# Patient Record
Sex: Female | Born: 1975 | Race: Black or African American | Hispanic: No | Marital: Single | Smoking: Current every day smoker
Health system: Southern US, Community
[De-identification: ages and names within clinical notes are randomized; demographics above are authoritative.]

## PROBLEM LIST (undated history)

## (undated) DIAGNOSIS — F419 Anxiety disorder, unspecified: Secondary | ICD-10-CM

## (undated) DIAGNOSIS — J45909 Unspecified asthma, uncomplicated: Secondary | ICD-10-CM

## (undated) DIAGNOSIS — I10 Essential (primary) hypertension: Secondary | ICD-10-CM

## (undated) DIAGNOSIS — K649 Unspecified hemorrhoids: Secondary | ICD-10-CM

## (undated) DIAGNOSIS — R519 Headache, unspecified: Secondary | ICD-10-CM

## (undated) DIAGNOSIS — R51 Headache: Secondary | ICD-10-CM

## (undated) HISTORY — DX: Headache, unspecified: R51.9

## (undated) HISTORY — PX: TUBAL LIGATION: SHX77

## (undated) HISTORY — PX: KNEE SURGERY: SHX244

## (undated) HISTORY — PX: OTHER SURGICAL HISTORY: SHX169

## (undated) HISTORY — DX: Essential (primary) hypertension: I10

## (undated) HISTORY — DX: Headache: R51

## (undated) HISTORY — DX: Unspecified asthma, uncomplicated: J45.909

## (undated) HISTORY — DX: Unspecified hemorrhoids: K64.9

## (undated) HISTORY — DX: Anxiety disorder, unspecified: F41.9

## (undated) HISTORY — PX: CERVICAL SPINE SURGERY: SHX589

---

## 2006-04-07 ENCOUNTER — Ambulatory Visit: Payer: Self-pay | Admitting: Gastroenterology

## 2012-05-27 HISTORY — PX: COLONOSCOPY: SHX174

## 2012-11-04 ENCOUNTER — Encounter: Payer: Self-pay | Admitting: Obstetrics and Gynecology

## 2013-06-23 ENCOUNTER — Encounter: Payer: Self-pay | Admitting: Nurse Practitioner

## 2013-06-26 ENCOUNTER — Encounter: Payer: Self-pay | Admitting: *Deleted

## 2013-06-27 ENCOUNTER — Ambulatory Visit (INDEPENDENT_AMBULATORY_CARE_PROVIDER_SITE_OTHER): Payer: Managed Care, Other (non HMO) | Admitting: Nurse Practitioner

## 2013-06-27 ENCOUNTER — Encounter: Payer: Self-pay | Admitting: *Deleted

## 2013-06-27 VITALS — BP 134/76 | HR 86 | Ht 66.0 in | Wt 216.0 lb

## 2013-06-27 DIAGNOSIS — K648 Other hemorrhoids: Secondary | ICD-10-CM

## 2013-06-27 NOTE — Progress Notes (Signed)
HPI :   Patient is a 38 year old female, new to this practice, referred by PCP for evaluation of hemorrhoids. Patient has a strong family history of colon cancer. Her mother died at 37 years of age with colon cancer, her sister passed away at 37 years of age with colon cancer. Patient had a screening colon[place] March of last [place]. No polyps or other abnormalities found. Patient does not want to return to that gastroenterologist (Dr. Teena DunkBenson). Records to be scanned in EPIC.   Patient denies any previous history of hemorrhoids. Around the 9th of this month she developed some rectal discomfort and bleeding despite completely normal bowel movements. The patient went to her PCP and was prescribed steroid suppositories which she has been using 3 times a day for the last 8 days. In addition, patient was prescribed Bactrim for the "fluctuant" hemorrhoid.. Symptoms did not improve, patient returned to PCP at which time she was given a gram of Rocephin. She has been taking sitz baths. Continues to have some rectal discomfort as well as streaks of blood when wiping. No other GI complaints.  Past Medical History  Diagnosis Date  . Anxiety   . Asthmatic bronchitis   . Headache   . Hemorrhoid   . Hypertension    Family History  Problem Relation Age of Onset  . Colon cancer Mother     Deceased 2002   History  Substance Use Topics  . Smoking status: Current Every Day Smoker  . Smokeless tobacco: Never Used  . Alcohol Use: Yes   Current Outpatient Prescriptions  Medication Sig Dispense Refill  . ALPRAZolam (XANAX) 0.5 MG tablet Take 0.5 mg by mouth at bedtime as needed for anxiety.      Marland Kitchen. HYDROcodone-acetaminophen (NORCO/VICODIN) 5-325 MG per tablet Take 1 tablet by mouth every 6 (six) hours as needed for moderate pain.      Marland Kitchen. lisinopril-hydrochlorothiazide (PRINZIDE,ZESTORETIC) 20-12.5 MG per tablet Take 1 tablet by mouth daily.       No current facility-administered  medications for this visit.   No Known Allergies  Review of Systems: All systems reviewed and negative except where noted in HPI.   Physical Exam: BP 134/76  Pulse 86  Ht 5\' 6"  (1.676 m)  Wt 216 lb (97.977 kg)  BMI 34.88 kg/m2 Constitutional: Pleasant,well-developed, black female in no acute distress. HEENT: Normocephalic and atraumatic. Conjunctivae are normal. No scleral icterus. Neck supple.  Cardiovascular: Normal rate, regular rhythm.  Pulmonary/chest: Effort normal and breath sounds normal. No wheezing, rales or rhonchi. Abdominal: Soft, nondistended, nontender. Bowel sounds active throughout. There are no masses palpable. No hepatomegaly. Rectal: Two external hemorrhoid tags. On anoscopy there were significantly inflamed internal hemorrhoids.  Extremities: no edema Lymphadenopathy: No cervical adenopathy noted. Neurological: Alert and oriented to person place and time. Skin: Skin is warm and dry. No rashes noted. Psychiatric: Normal mood and affect. Behavior is normal.   ASSESSMENT AND PLAN: 38 year old female with hemorrhoidal bleeding and discomfort. She has been taking sitz baths and using rx steroid suppositories TID for 8 days now. Her main concern is the bleeding. This is her first episode of hemorrhoidal problems, it may be a little premature for banding but then again she isn't responding to conservative therapy. Will schedule patient for office banding to be done in a couple of weeks. Patient will call in the interim if symptoms resolve. In meantime I think she could discontinue steroid suppositories as they have been of  limited benefit.

## 2013-06-27 NOTE — Patient Instructions (Signed)
You where scheduled for a hemorrhoid banding with Dr. Arlyce DiceKaplan on 08-21-2013 at 945 am. Please call the office back if symptoms reoccur or get worse. Please avoid constipation, information is below for you to review. __________________________________________________________________________________________   Constipation, Adult Constipation is when a person has fewer than 3 bowel movements a week; has difficulty having a bowel movement; or has stools that are dry, hard, or larger than normal. As people grow older, constipation is more common. If you try to fix constipation with medicines that make you have a bowel movement (laxatives), the problem may get worse. Long-term laxative use may cause the muscles of the colon to become weak. A low-fiber diet, not taking in enough fluids, and taking certain medicines may make constipation worse. CAUSES   Certain medicines, such as antidepressants, pain medicine, iron supplements, antacids, and water pills.   Certain diseases, such as diabetes, irritable bowel syndrome (IBS), thyroid disease, or depression.   Not drinking enough water.   Not eating enough fiber-rich foods.   Stress or travel.  Lack of physical activity or exercise.  Not going to the restroom when there is the urge to have a bowel movement.  Ignoring the urge to have a bowel movement.  Using laxatives too much. SYMPTOMS   Having fewer than 3 bowel movements a week.   Straining to have a bowel movement.   Having hard, dry, or larger than normal stools.   Feeling full or bloated.   Pain in the lower abdomen.  Not feeling relief after having a bowel movement. DIAGNOSIS  Your caregiver will take a medical history and perform a physical exam. Further testing may be done for severe constipation. Some tests may include:   A barium enema X-ray to examine your rectum, colon, and sometimes, your small intestine.  A sigmoidoscopy to examine your lower colon.  A  colonoscopy to examine your entire colon. TREATMENT  Treatment will depend on the severity of your constipation and what is causing it. Some dietary treatments include drinking more fluids and eating more fiber-rich foods. Lifestyle treatments may include regular exercise. If these diet and lifestyle recommendations do not help, your caregiver may recommend taking over-the-counter laxative medicines to help you have bowel movements. Prescription medicines may be prescribed if over-the-counter medicines do not work.  HOME CARE INSTRUCTIONS   Increase dietary fiber in your diet, such as fruits, vegetables, whole grains, and beans. Limit high-fat and processed sugars in your diet, such as Jamaica[place] fries, hamburgers, cookies, candies, and soda.   A fiber supplement may be added to your diet if you cannot get enough fiber from foods.   Drink enough fluids to keep your urine clear or pale yellow.   Exercise regularly or as directed by your caregiver.   Go to the restroom when you have the urge to go. Do not hold it.  Only take medicines as directed by your caregiver. Do not take other medicines for constipation without talking to your caregiver first. SEEK IMMEDIATE MEDICAL CARE IF:   You have bright red blood in your stool.   Your constipation lasts for more than 4 days or gets worse.   You have abdominal or rectal pain.   You have thin, pencil-like stools.  You have unexplained weight loss. MAKE SURE YOU:   Understand these instructions.  Will watch your condition.  Will get help right away if you are not doing well or get worse. Document Released: 11/22/2003 Document Revised: 05/18/2011 Document Reviewed: 12/05/2012 ExitCare Patient Information  2014 [employer].  

## 2013-06-28 NOTE — Progress Notes (Signed)
Reviewed and agree with management. Laurie Lovejoy D. Kaitlynn Tramontana, M.D., FACG  

## 2013-08-21 ENCOUNTER — Encounter: Payer: Managed Care, Other (non HMO) | Admitting: Gastroenterology

## 2014-06-14 ENCOUNTER — Ambulatory Visit: Payer: Managed Care, Other (non HMO) | Admitting: Orthopedic Surgery

## 2014-06-14 ENCOUNTER — Encounter: Payer: Self-pay | Admitting: Orthopedic Surgery

## 2014-09-17 ENCOUNTER — Encounter: Payer: Self-pay | Admitting: Obstetrics and Gynecology

## 2019-03-21 ENCOUNTER — Ambulatory Visit: Payer: Managed Care, Other (non HMO) | Attending: Internal Medicine

## 2019-03-21 ENCOUNTER — Other Ambulatory Visit: Payer: Self-pay

## 2019-03-21 DIAGNOSIS — Z20822 Contact with and (suspected) exposure to covid-19: Secondary | ICD-10-CM

## 2019-03-22 LAB — NOVEL CORONAVIRUS, NAA: SARS-CoV-2, NAA: NOT DETECTED

## 2019-07-21 DIAGNOSIS — Z0000 Encounter for general adult medical examination without abnormal findings: Secondary | ICD-10-CM | POA: Diagnosis not present

## 2019-08-03 DIAGNOSIS — D259 Leiomyoma of uterus, unspecified: Secondary | ICD-10-CM | POA: Diagnosis not present

## 2019-08-03 DIAGNOSIS — N939 Abnormal uterine and vaginal bleeding, unspecified: Secondary | ICD-10-CM | POA: Diagnosis not present

## 2019-08-21 DIAGNOSIS — G43919 Migraine, unspecified, intractable, without status migrainosus: Secondary | ICD-10-CM | POA: Diagnosis not present

## 2019-08-23 DIAGNOSIS — R5383 Other fatigue: Secondary | ICD-10-CM | POA: Diagnosis not present

## 2019-08-23 DIAGNOSIS — Z6831 Body mass index (BMI) 31.0-31.9, adult: Secondary | ICD-10-CM | POA: Diagnosis not present

## 2019-08-23 DIAGNOSIS — R42 Dizziness and giddiness: Secondary | ICD-10-CM | POA: Diagnosis not present

## 2019-08-23 DIAGNOSIS — G43919 Migraine, unspecified, intractable, without status migrainosus: Secondary | ICD-10-CM | POA: Diagnosis not present

## 2019-09-30 ENCOUNTER — Emergency Department (HOSPITAL_COMMUNITY)
Admission: EM | Admit: 2019-09-30 | Discharge: 2019-09-30 | Disposition: A | Discharge status: Home / Self Care | Payer: Managed Care, Other (non HMO) | Attending: Emergency Medicine | Admitting: Emergency Medicine

## 2019-09-30 ENCOUNTER — Encounter (HOSPITAL_COMMUNITY): Payer: Self-pay | Admitting: *Deleted

## 2019-09-30 ENCOUNTER — Other Ambulatory Visit: Payer: Self-pay

## 2019-09-30 DIAGNOSIS — R220 Localized swelling, mass and lump, head: Secondary | ICD-10-CM | POA: Insufficient documentation

## 2019-09-30 DIAGNOSIS — J45909 Unspecified asthma, uncomplicated: Secondary | ICD-10-CM | POA: Insufficient documentation

## 2019-09-30 DIAGNOSIS — F17200 Nicotine dependence, unspecified, uncomplicated: Secondary | ICD-10-CM | POA: Insufficient documentation

## 2019-09-30 DIAGNOSIS — K047 Periapical abscess without sinus: Secondary | ICD-10-CM | POA: Insufficient documentation

## 2019-09-30 DIAGNOSIS — I10 Essential (primary) hypertension: Secondary | ICD-10-CM | POA: Insufficient documentation

## 2019-09-30 MED ORDER — HYDROCODONE-ACETAMINOPHEN 5-325 MG PO TABS: 1.0000 | ORAL_TABLET | Freq: Four times a day (QID) | ORAL | 0 refills | Status: DC | PRN

## 2019-09-30 MED ORDER — AMOXICILLIN-POT CLAVULANATE 875-125 MG PO TABS: 1.0000 | ORAL_TABLET | Freq: Two times a day (BID) | ORAL | 0 refills | Status: AC

## 2019-09-30 NOTE — ED Triage Notes (Signed)
Pt c/o dental pain x 3 days to upper right tooth;

## 2019-09-30 NOTE — ED Provider Notes (Signed)
[facility] EMERGENCY DEPARTMENT Provider Note   CSN: [id number] Arrival date & time: 09/30/19  1353     History Chief Complaint  Patient presents with  . Dental Pain    Annette Li is a 44 y.o. female who presents for evaluation of right-sided dental pain.  Annette Li reports initially Annette Li cracked her tooth about a week or so ago.  Annette Li has had intermittent pain since then.  Annette Li called her dentist and was told to use over-the-counter filling and was told that they would get her in as soon as possible.  The earliest appointment was good to be 10/18/2019.  Annette Li states that Annette Li has been using over-the-counter medication such as Tylenol, Aleve.  Annette Li reports that about 4 days ago, the pain got worse and Annette Li started having facial swelling.  Annette Li states the pain despite taking over the medication using topical dental filler, her pain has increased and Annette Li started having worsening facial swelling.  Annette Li has not any difficulty breathing is able to tolerate secretions without any difficulty.  Annette Li has not noted any fever.  The history is provided by the patient.       Past Medical History:  Diagnosis Date  . Anxiety   . Asthmatic bronchitis   . Headache   . Hemorrhoid   . Hypertension     Patient Active Problem List   Diagnosis Date Noted  . Internal hemorrhoids 06/27/2013    Past Surgical History:  Procedure Laterality Date  . CERVICAL SPINE SURGERY    . COLONOSCOPY  05/27/2012   (normal)Dr. Benson--[place]  . KNEE SURGERY    . TUBAL LIGATION       OB History   No obstetric history on file.     Family History  Problem Relation Age of Onset  . Colon cancer Mother        Deceased 07/12/00    Social History   Tobacco Use  . Smoking status: Current Every Day Smoker    Packs/day: 0.15  . Smokeless tobacco: Never Used  Substance Use Topics  . Alcohol use: Yes    Comment: occasionally  . Drug use: No    Home Medications Prior to Admission medications   Medication Sig Start  Date End Date Taking? Authorizing Provider  ALPRAZolam Prudy Feeler) 0.5 MG tablet Take 0.5 mg by mouth at bedtime as needed for anxiety.    [provider]  amoxicillin-clavulanate (AUGMENTIN) 875-125 MG tablet Take 1 tablet by mouth every 12 (twelve) hours for 7 days. 09/30/19 10/07/19  Maxwell Caul, PA-C  HYDROcodone-acetaminophen (NORCO/VICODIN) 5-325 MG tablet Take 1-2 tablets by mouth every 6 (six) hours as needed. 09/30/19   Maxwell Caul, PA-C  lisinopril-hydrochlorothiazide (PRINZIDE,ZESTORETIC) 20-12.5 MG per tablet Take 1 tablet by mouth daily.    [provider]    Allergies    Patient has no known allergies.  Review of Systems   Review of Systems  Constitutional: Negative for fever.  HENT: Positive for dental problem and facial swelling. Negative for trouble swallowing.   Respiratory: Negative for shortness of breath.   Gastrointestinal: Negative for vomiting.  All other systems reviewed and are negative.   Physical Exam Updated Vital Signs BP (!) 145/95 (BP Location: Right Arm)   Pulse 92   Temp 99.2 F (37.3 C) (Oral)   Resp 18   Ht 5\' 4"  (1.626 m)   Wt 83.9 kg   LMP 09/16/2019   SpO2 100%   BMI 31.76 kg/m   Physical Exam  Vitals and nursing note reviewed.  Constitutional:      Appearance: Annette Li is well-developed.  HENT:     Head: Normocephalic and atraumatic.      Comments: Slight facial swelling noted to the right upper face.  No surrounding warmth, erythema.  Tooth #3 is partially cracked and a small fragment is remaining.  Tooth #2, 1 with dental caries.  There is some surrounding gingival erythema.  No obvious identifiable abscess.  Uvula is midline, airways patent.  No submandibular swelling.  No swelling noted floor of mouth.  No edema noted to the neck. Eyes:     General: No scleral icterus.       Right eye: No discharge.        Left eye: No discharge.     Conjunctiva/sclera: Conjunctivae normal.  Pulmonary:     Effort: Pulmonary  effort is normal.  Skin:    General: Skin is warm and dry.  Neurological:     Mental Status: Annette Li is alert.  Psychiatric:        Speech: Speech normal.        Behavior: Behavior normal.     ED Results / Procedures / Treatments   Labs (all labs ordered are listed, but only abnormal results are displayed) Labs Reviewed - No data to display  EKG None  Radiology No results found.  Procedures Procedures (including critical care time)  Medications Ordered in ED Medications - No data to display  ED Course  I have reviewed the triage vital signs and the nursing notes.  Pertinent labs & imaging results that were available during my care of the patient were reviewed by me and considered in my medical decision making (see chart for details).    MDM Rules/Calculators/A&P                          44 year old female who presents for evaluation of dental pain has been ongoing for last week or so worse in the last 4 days with facial swelling.  No vomiting, fevers, difficulty breathing, difficulty tolerating p.o.  On initially arrival, Annette Li is afebrile, nontoxic-appearing.  Vital signs are stable.  On exam, Annette Li does have some right-sided facial swelling.  No surrounding warmth, erythema.  Tooth #1, 2, 3R with evidence of dental caries.  Tooth #3 is partially cracked with only a small fragment remaining.  Annette Li does have some surrounding gingival erythema.  No obvious abscess that is identified.  Plan to treat with antibiotics.  History/physical exam is not concerning for Ludwig angina.  Patient reviewed on PMP.  Will give short course of pain medication given that it is the weekend.  Patient directed to follow-up with her dentist.  Patient instructed to call her dentist on Monday. At this time, patient exhibits no emergent life-threatening condition that require further evaluation in ED or admission. Patient had ample opportunity for questions and discussion. All patient's questions were answered  with full understanding. Strict return precautions discussed. Patient expresses understanding and agreement to plan.   Portions of this note were generated with Scientist, clinical (histocompatibility and immunogenetics). Dictation errors may occur despite best attempts at proofreading.   Final Clinical Impression(s) / ED Diagnoses Final diagnoses:  Dental abscess    Rx / DC Orders ED Discharge Orders         Ordered    amoxicillin-clavulanate (AUGMENTIN) 875-125 MG tablet  Every 12 hours     Discontinue  Reprint     09/30/19 1453  HYDROcodone-acetaminophen (NORCO/VICODIN) 5-325 MG tablet  Every 6 hours PRN     Discontinue  Reprint     09/30/19 1453           Rosana Hoes 09/30/19 1504    Bethann Berkshire, MD 10/01/19 0900

## 2019-09-30 NOTE — Discharge Instructions (Signed)
Call your dentist on Monday.  Take antibiotics as directed. Please take all of your antibiotics until finished.  You can take Tylenol or Ibuprofen as directed for pain. You can alternate Tylenol and Ibuprofen every 4 hours. If you take Tylenol at 1pm, then you can take Ibuprofen at 5pm. Then you can take Tylenol again at 9pm.   Take pain medications as directed for break through pain. Do not drive or operate machinery while taking this medication.   The exam and treatment you received today has been provided on an emergency basis only. This is not a substitute for complete medical or dental care. If your problem worsens or new symptoms (problems) appear, and you are unable to arrange prompt follow-up care with your dentist, call or return to this location. If you do not have a dentist, please follow-up with one on the list provided  CALL YOUR DENTIST OR RETURN IMMEDIATELY IF you develop a fever, rash, difficulty breathing or swallowing, neck or facial swelling, or other potentially serious concerns.  Please follow-up with one of the dental clinics provided to you below or in your paperwork. Call and tell them you were seen in the Emergency Dept and arrange for an appointment. You may have to call multiple places in order to find a place to be seen.  Dental Assistance If the dentist on-call cannot see you, please use the resources below:   Patients with Medicaid: [facility] [phone] [address], [phone] [address], [phone]  If unable to pay, or uninsured, contact [facility] [phone]) or [facility] Department [phone] in [place], [phone] in [facility]) to become qualified for the adult dental clinic  Other Low-Cost Community Dental Services: [facility]- [address] Natasha Bence [place], [place], [address]    [phone], Ext. [phone]    2nd and 4th Thursday of the month at 6:30am    10 clients each day by appointment, can sometimes see walk-in          patients if someone does not show for an appointment [facility]- [address] [place], [place], [address]    [phone] [facility] [address] [place], [place], [address]    [phone]  [facility]- [phone] [facility] Department- [phone] [facility] Department- [phone]

## 2020-02-20 ENCOUNTER — Emergency Department (HOSPITAL_COMMUNITY)
Admission: EM | Admit: 2020-02-20 | Discharge: 2020-02-20 | Disposition: A | Discharge status: Home / Self Care | Payer: Self-pay | Attending: Emergency Medicine | Admitting: Emergency Medicine

## 2020-02-20 ENCOUNTER — Encounter (HOSPITAL_COMMUNITY): Payer: Self-pay | Admitting: *Deleted

## 2020-02-20 ENCOUNTER — Emergency Department (HOSPITAL_COMMUNITY): Payer: Self-pay

## 2020-02-20 ENCOUNTER — Other Ambulatory Visit: Payer: Self-pay

## 2020-02-20 DIAGNOSIS — F17200 Nicotine dependence, unspecified, uncomplicated: Secondary | ICD-10-CM | POA: Insufficient documentation

## 2020-02-20 DIAGNOSIS — S139XXA Sprain of joints and ligaments of unspecified parts of neck, initial encounter: Secondary | ICD-10-CM | POA: Insufficient documentation

## 2020-02-20 DIAGNOSIS — Y92410 Unspecified street and highway as the place of occurrence of the external cause: Secondary | ICD-10-CM | POA: Insufficient documentation

## 2020-02-20 DIAGNOSIS — I10 Essential (primary) hypertension: Secondary | ICD-10-CM | POA: Insufficient documentation

## 2020-02-20 DIAGNOSIS — G44319 Acute post-traumatic headache, not intractable: Secondary | ICD-10-CM | POA: Insufficient documentation

## 2020-02-20 DIAGNOSIS — M25552 Pain in left hip: Secondary | ICD-10-CM | POA: Insufficient documentation

## 2020-02-20 LAB — PREGNANCY, URINE: Preg Test, Ur: NEGATIVE

## 2020-02-20 MED ORDER — METHOCARBAMOL 500 MG PO TABS: 500.0000 mg | ORAL_TABLET | Freq: Two times a day (BID) | ORAL | 0 refills | Status: DC

## 2020-02-20 MED ORDER — HYDROCODONE-ACETAMINOPHEN 5-325 MG PO TABS
1.0000 | ORAL_TABLET | Freq: Once | ORAL | Status: AC
  Administered 2020-02-20: 11:00:00 1 via ORAL
  Filled 2020-02-20: qty 1

## 2020-02-20 MED ORDER — NAPROXEN 500 MG PO TABS: 500.0000 mg | ORAL_TABLET | Freq: Two times a day (BID) | ORAL | 0 refills | Status: DC

## 2020-02-20 MED ORDER — LIDOCAINE 5 % EX PTCH: 1.0000 | MEDICATED_PATCH | CUTANEOUS | 0 refills | Status: DC
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
## 2020-02-20 NOTE — ED Notes (Signed)
Patient transported to CT 
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
## 2020-10-21 ENCOUNTER — Ambulatory Visit: Payer: Self-pay

## 2020-10-21 NOTE — Telephone Encounter (Signed)
Pt c/o severe vaginal bleeding. Pt stated having baseball sized clots and continuous bleeding. Pt stated she feels occasional lightheadedness but stated not during call. Pt stated that her period started 19 days ago but stopped for a day or 2 then began to bleed harder. Pt stated she is having severe intermittent menstrual type cramping. Advised pt to go to [facility] ED - advised to be driven there. Address and phone number provided. Care advice given and pt verbalized understanding.         Reason for Disposition  SEVERE vaginal bleeding (e.g., soaking 2 pads or tampons per hour and present 2 or more hours; 1 menstrual cup every 2 hours)  Answer Assessment - Initial Assessment Questions 1. AMOUNT: "Describe the bleeding that you are having."    - SPOTTING: spotting, or pinkish / brownish mucous discharge; does not fill panty liner or pad    - MILD:  less than 1 pad / hour; less than patient's usual menstrual bleeding   - MODERATE: 1-2 pads / hour; 1 menstrual cup every 6 hours; small-medium blood clots (e.g., pea, grape, small coin)   - SEVERE: soaking 2 or more pads/hour for 2 or more hours; 1 menstrual cup every 2 hours; bleeding not contained by pads or continuous red blood from vagina; large blood clots (e.g., golf ball, large coin)      severe 2. ONSET: "When did the bleeding begin?" "Is it continuing now?"     19 days ago 3. MENSTRUAL PERIOD: "When was the last normal menstrual period?" "How is this different than your period?"     19 days ago - very heavy flow and clots 4. REGULARITY: "How regular are your periods?"     Irregular  5. ABDOMINAL PAIN: "Do you have any pain?" "How bad is the pain?"  (e.g., Scale 1-10; mild, moderate, or severe)   - MILD (1-3): doesn't interfere with normal activities, abdomen soft and not tender to touch    - MODERATE (4-7): interferes with normal activities or awakens from sleep, abdomen tender to touch    - SEVERE (8-10): excruciating pain,  doubled over, unable to do any normal activities      severe 6. PREGNANCY: "Could you be pregnant?" "Are you sexually active?" "Did you recently give birth?"     No-no-no 7. BREASTFEEDING: "Are you breastfeeding?"     no 8. HORMONES: "Are you taking any hormone medications, prescription or OTC?" (e.g., birth control pills, estrogen)     no 9. BLOOD THINNERS: "Do you take any blood thinners?" (e.g., Coumadin/warfarin, Pradaxa/dabigatran, aspirin)     no 10. CAUSE: "What do you think is causing the bleeding?" (e.g., recent gyn surgery, recent gyn procedure; known bleeding disorder, cervical cancer, polycystic ovarian disease, fibroids)         H/o fibroids when young pt stated that GYN said no longer has fibroids 11. HEMODYNAMIC STATUS: "Are you weak or feeling lightheaded?" If Yes, ask: "Can you stand and walk normally?"       Not right now- yes 12. OTHER SYMPTOMS: "What other symptoms are you having with the bleeding?" (e.g., passed tissue, vaginal discharge, fever, menstrual-type cramps)       Menstrual type, clots baseball sized  Protocols used: Vaginal Bleeding - Abnormal-A-AH

## 2020-12-25 ENCOUNTER — Encounter: Payer: Self-pay | Admitting: Internal Medicine

## 2021-03-20 ENCOUNTER — Ambulatory Visit: Payer: BC Managed Care – PPO

## 2021-03-20 ENCOUNTER — Other Ambulatory Visit: Payer: Self-pay

## 2021-03-20 ENCOUNTER — Encounter: Payer: Self-pay | Admitting: Orthopedic Surgery

## 2021-03-20 ENCOUNTER — Ambulatory Visit (INDEPENDENT_AMBULATORY_CARE_PROVIDER_SITE_OTHER): Payer: BC Managed Care – PPO | Admitting: Orthopedic Surgery

## 2021-03-20 VITALS — BP 162/117 | HR 99 | Ht 64.0 in | Wt 208.0 lb

## 2021-03-20 DIAGNOSIS — G8929 Other chronic pain: Secondary | ICD-10-CM

## 2021-03-20 DIAGNOSIS — M222X1 Patellofemoral disorders, right knee: Secondary | ICD-10-CM | POA: Diagnosis not present

## 2021-03-20 DIAGNOSIS — M25562 Pain in left knee: Secondary | ICD-10-CM | POA: Diagnosis not present

## 2021-03-20 DIAGNOSIS — M222X2 Patellofemoral disorders, left knee: Secondary | ICD-10-CM | POA: Diagnosis not present

## 2021-03-20 DIAGNOSIS — M25561 Pain in right knee: Secondary | ICD-10-CM | POA: Diagnosis not present

## 2021-03-20 MED ORDER — MELOXICAM 7.5 MG PO TABS: 7.5000 mg | ORAL_TABLET | Freq: Every day | ORAL | 5 refills | Status: DC

## 2021-03-20 NOTE — Progress Notes (Signed)
Chief Complaint  Patient presents with   Knee Pain    Bilateral R>L Knee giving way Painful x 3 mths    HPI: This is a 46 year old female presents with pain in her right and left knee.  She started having pain in her right knee back in 2012 and had arthroscopic surgery did well until about 2 months ago when she started having pain in the front of the knee associated with crepitance and swelling.  As this progressed she started having some pain in the left knee as she depended on it more for ambulation    ROS:  Negative for chest pain shortness of breath constipation diarrhea seasonal allergy   Past Medical History:  Diagnosis Date   Anxiety    Asthmatic bronchitis    Headache    Hemorrhoid    Hypertension     BP (!) 162/117    Pulse 99    Ht 5\' 4"  (1.626 m)    Wt 208 lb (94.3 kg)    BMI 35.70 kg/m   Physical Exam Constitutional:      General: She is not in acute distress.    Appearance: She is well-developed.     Comments: Well developed, well nourished Normal grooming and hygiene     Cardiovascular:     Comments: No peripheral edema Musculoskeletal:     Comments: Right knee Skin is normal She is tender around the parapatellar structures with positive crepitance on patellar compression positive quadriceps contraction test Range of motion is normal no effusion All ligaments are stable Muscle tone straight leg raise are normal  Left knee again skin is normal She is tender around the patella her range of motion is normal there is no effusion Ligaments are stable Muscle tone and straight leg raise are normal she has positive quadriceps contraction test and crepitance on patellar compression   Skin:    General: Skin is warm and dry.  Neurological:     Mental Status: She is alert and oriented to person, place, and time.     Sensory: No sensory deficit.     Coordination: Coordination normal.     Gait: Gait normal.     Deep Tendon Reflexes: Reflexes are normal and  symmetric.  Psychiatric:        Mood and Affect: Mood normal.        Behavior: Behavior normal.        Thought Content: Thought content normal.        Judgment: Judgment normal.     Comments: Affect normal    X-rays were taken of both knees and the patella alignment is normal there is no subluxation dislocation or tilt she does have mild peripheral osteophytes around the tibia  Encounter Diagnoses  Name Primary?   Chronic pain of left knee    Chronic pain of right knee    Patellofemoral pain syndrome of both knees Yes   I think this should easily be managed with oral anti-inflammatories, home exercises and bilateral knee injections  Meds ordered this encounter  Medications   meloxicam (MOBIC) 7.5 MG tablet    Sig: Take 1 tablet (7.5 mg total) by mouth daily.    Dispense:  30 tablet    Refill:  5     Procedure note for bilateral knee injections  Procedure note left knee injection verbal consent was obtained to inject left knee joint  Timeout was completed to confirm the site of injection  The medications used were 40 mg depomedrol  and 3 cc of 1% lidocaine  Anesthesia was provided by ethyl chloride and the skin was prepped with alcohol.  After cleaning the skin with alcohol a 20-gauge needle was used to inject the left knee joint. There were no complications. A sterile bandage was applied.   Procedure note right knee injection verbal consent was obtained to inject right knee joint  Timeout was completed to confirm the site of injection  The medications used were 40 mg depomedrol and 3 cc of 1% lidocaine  Anesthesia was provided by ethyl chloride and the skin was prepped with alcohol.  After cleaning the skin with alcohol a 20-gauge needle was used to inject the right knee joint. There were no complications. A sterile bandage was applied.

## 2021-04-01 ENCOUNTER — Other Ambulatory Visit (HOSPITAL_COMMUNITY): Payer: Self-pay | Admitting: Preventative Medicine

## 2021-04-01 ENCOUNTER — Ambulatory Visit (HOSPITAL_COMMUNITY)
Admission: RE | Admit: 2021-04-01 | Discharge: 2021-04-01 | Disposition: A | Discharge status: Home / Self Care | Payer: Worker's Compensation | Source: Ambulatory Visit | Attending: Preventative Medicine | Admitting: Preventative Medicine

## 2021-04-01 ENCOUNTER — Other Ambulatory Visit: Payer: Self-pay

## 2021-04-01 DIAGNOSIS — T1490XA Injury, unspecified, initial encounter: Secondary | ICD-10-CM | POA: Diagnosis not present

## 2021-05-14 ENCOUNTER — Ambulatory Visit: Payer: Medicaid Other | Admitting: Gastroenterology

## 2021-05-14 ENCOUNTER — Encounter: Payer: Self-pay | Admitting: Internal Medicine

## 2021-06-03 ENCOUNTER — Encounter: Payer: Self-pay | Admitting: *Deleted

## 2021-08-07 ENCOUNTER — Ambulatory Visit (INDEPENDENT_AMBULATORY_CARE_PROVIDER_SITE_OTHER): Payer: Self-pay | Admitting: *Deleted

## 2021-08-07 VITALS — Ht 63.0 in | Wt 200.0 lb

## 2021-08-07 DIAGNOSIS — Z1211 Encounter for screening for malignant neoplasm of colon: Secondary | ICD-10-CM
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
## 2021-08-07 NOTE — Progress Notes (Signed)
Okay to schedule.  ASA 2.  We will need urine pregnancy test at preop.

## 2021-08-08 NOTE — Progress Notes (Signed)
Gave Annette Li triage paperwork to schedule in future when pt calls back. 

## 2021-08-08 NOTE — Progress Notes (Signed)
08/08/2021-Lmom for pt to call me back. 

## 2021-08-20 ENCOUNTER — Encounter: Payer: Self-pay | Admitting: *Deleted

## 2021-09-05 ENCOUNTER — Encounter: Payer: Self-pay | Admitting: *Deleted

## 2021-09-05 DIAGNOSIS — Z1211 Encounter for screening for malignant neoplasm of colon: Secondary | ICD-10-CM

## 2021-09-05 MED ORDER — PEG 3350-KCL-NA BICARB-NACL 420 G PO SOLR: ORAL | 0 refills | Status: DC

## 2021-09-05 NOTE — Progress Notes (Signed)
Pt returned call. Scheduled for 8/1 at 10:30am. Aware will mail instructions. Rx sent to pharmacy

## 2021-10-02 ENCOUNTER — Telehealth: Payer: Self-pay | Admitting: *Deleted

## 2021-10-02 NOTE — Telephone Encounter (Signed)
Pt called in. She was a triage for TCS scheduled for 10/07/21. Reports she went to ED and was told to cancel her procedure until she see's a cardiologists and get's approval to be put to sleep by them. Procedure cancelled. FYI to [employer]

## 2021-10-07 ENCOUNTER — Ambulatory Visit (HOSPITAL_COMMUNITY): Admission: RE | Admit: 2021-10-07 | Payer: BC Managed Care – PPO | Source: Home / Self Care

## 2021-10-07 ENCOUNTER — Encounter (HOSPITAL_COMMUNITY): Admission: RE | Payer: Self-pay | Source: Home / Self Care

## 2021-10-07 SURGERY — COLONOSCOPY WITH PROPOFOL
Anesthesia: Monitor Anesthesia Care

## 2021-10-27 NOTE — Progress Notes (Signed)
Cardiology Office Note:    Date:  10/31/2021   ID:  ROLINDA IMPSON, DOB May 29, 1975, MRN [id number]  PCP:  Juliette Alcide, MD   [facility] Providers Cardiologist:  None     Referring MD: Royann Shivers, *   Chief Complaint  Patient presents with   Chest Pain   Hypertension    History of Present Illness:    Annette Li is a 46 y.o. female seen at the request of Wayland Denis PA-C for evaluation of chest pain. She has a history of HTN. She reports that over the past 2 months her  BP has been difficult to control. She has been on losartan for the past 4 years. She also has been experiencing tightness in her chest that is worse with exertion and relieved with rest. She is a smoker. She is currently out on workman's comp for injury in January. Reports injury to right rotator cuff and back. Lipid status is unknown. Was in the ED in July with severe HA. BP elevated. CT head negative.   Past Medical History:  Diagnosis Date   Anxiety    Asthmatic bronchitis    Headache    Hemorrhoid    Hypertension     Past Surgical History:  Procedure Laterality Date   CERVICAL SPINE SURGERY     COLONOSCOPY  05/27/2012   (normal)Dr. Benson--[place]   KNEE SURGERY     TUBAL LIGATION      Current Medications: Current Meds  Medication Sig   ALPRAZolam (XANAX) 0.5 MG tablet Take 0.5 mg by mouth 3 (three) times daily.   amLODipine (NORVASC) 5 MG tablet Take 1 tablet (5 mg total) by mouth daily.   cyclobenzaprine (FLEXERIL) 10 MG tablet Take 10 mg by mouth at bedtime.   DULoxetine (CYMBALTA) 30 MG capsule Take 30 mg by mouth daily.   losartan (COZAAR) 100 MG tablet Take 100 mg by mouth daily.   metoprolol tartrate (LOPRESSOR) 100 MG tablet Take 100 mg 2 hours before coronary ct   omeprazole (PRILOSEC) 20 MG capsule Take 20 mg by mouth daily.   [DISCONTINUED] polyethylene glycol-electrolytes (NULYTELY) 420 g solution As directed     Allergies:   Patient has no known  allergies.   Social History   Socioeconomic History   Marital status: Single    Spouse name: Not on file   Number of children: Not on file   Years of education: Not on file   Highest education level: Not on file  Occupational History   Not on file  Tobacco Use   Smoking status: Every Day    Packs/day: 0.15    Types: Cigarettes   Smokeless tobacco: Never  Substance and Sexual Activity   Alcohol use: Yes    Comment: occasionally   Drug use: No   Sexual activity: Not on file  Other Topics Concern   Not on file  Social History Narrative   On Workman's comp   Social Determinants of Health   Financial Resource Strain: Not on file  Food Insecurity: Not on file  Transportation Needs: Not on file  Physical Activity: Not on file  Stress: Not on file  Social Connections: Not on file     Family History: The patient's family history includes Colon cancer in her mother and sister.  ROS:   Please see the history of present illness.     All other systems reviewed and are negative.  EKGs/Labs/Other Studies Reviewed:    The following studies  were reviewed today:   EKG:  EKG is  ordered today.  The ekg ordered today demonstrates NSR rate 96. Normal. I have personally reviewed and interpreted this study.   Recent Labs: No results found for requested labs within last 365 days.  Recent Lipid Panel No results found for: "CHOL", "TRIG", "HDL", "CHOLHDL", "VLDL", "LDLCALC", "LDLDIRECT" Labs dated 09/09/21: potassium 3.3 otherwise BMET normal. Normal CBC and Mg.   Risk Assessment/Calculations:      HYPERTENSION CONTROL Vitals:   10/31/21 1312 10/31/21 1344  BP: (!) 143/84 (!) 148/90    The patient's blood pressure is elevated above target today.  In order to address the patient's elevated BP: A new medication was prescribed today.            Physical Exam:    VS:  BP (!) 148/90 (BP Location: Right Arm, Patient Position: Sitting, Cuff Size: Large)   Pulse 96   Ht 5'  4" (1.626 m)   Wt 205 lb 3.2 oz (93.1 kg)   SpO2 99%   BMI 35.22 kg/m     Wt Readings from Last 3 Encounters:  10/31/21 205 lb 3.2 oz (93.1 kg)  08/07/21 200 lb (90.7 kg)  03/20/21 208 lb (94.3 kg)     GEN:  Well nourished, well developed in no acute distress HEENT: Normal NECK: No JVD; No carotid bruits LYMPHATICS: No lymphadenopathy CARDIAC: RRR, no murmurs, rubs, gallops RESPIRATORY:  Clear to auscultation without rales, wheezing or rhonchi  ABDOMEN: Soft, non-tender, non-distended MUSCULOSKELETAL:  No edema; No deformity  SKIN: Warm and dry NEUROLOGIC:  Alert and oriented x 3 PSYCHIATRIC:  Normal affect   ASSESSMENT:    1. Chest pain of uncertain etiology   2. Primary hypertension   3. Tobacco abuse   4. Precordial pain    PLAN:    In order of problems listed above:  Chest tightness exertional. This may be related to uncontrolled HTN but concern about angina in this patient with HTN and tobacco abuse. Will arrange for coronary CTA. Will also check lipid panel to further stratify risk. HTN uncontrolled. Will continue losartan. Add amlodipine 5 mg daily. Low sodium/DASH diet.  Tobacco abuse. Counseled on smoking cessation.           Medication Adjustments/Labs and Tests Ordered: Current medicines are reviewed at length with the patient today.  Concerns regarding medicines are outlined above.  Orders Placed This Encounter  Procedures   CT CORONARY MORPH W/CTA COR W/SCORE W/CA W/CM &/OR WO/CM   Basic metabolic panel   Lipid panel   EKG 12-Lead   Meds ordered this encounter  Medications   amLODipine (NORVASC) 5 MG tablet    Sig: Take 1 tablet (5 mg total) by mouth daily.    Dispense:  90 tablet    Refill:  3   metoprolol tartrate (LOPRESSOR) 100 MG tablet    Sig: Take 100 mg 2 hours before coronary ct    Dispense:  1 tablet    Refill:  0    Patient Instructions  Medication Instructions:  Continue same medications   Lab Work: Bmet and lipid panel  today    Testing/Procedures:  Coronary Ct    will be scheduled after approved by insurance   Follow instructions below   Follow-Up: At [facility], you and your health needs are our priority.  As part of our continuing mission to provide you with exceptional heart care, we have created designated Provider Care Teams.  These Care Teams include  your primary Cardiologist (physician) and Advanced Practice Providers (APPs -  Physician Assistants and Nurse Practitioners) who all work together to provide you with the care you need, when you need it.  We recommend signing up for the patient portal called "MyChart".  Sign up information is provided on this After Visit Summary.  MyChart is used to connect with patients for Virtual Visits (Telemedicine).  Patients are able to view lab/test results, encounter notes, upcoming appointments, etc.  Non-urgent messages can be sent to your provider as well.   To learn more about what you can do with MyChart, go to [email or web address].    Your next appointment:  After test    The format for your next appointment: Office    Provider:  Dr.Daylin Gruszka      Your cardiac CT will be scheduled at one of the below locations:   [facility] [address] [place], [place] [address] [phone]  [place] [address] [place], [place] [address] [phone]  If scheduled at [facility], please arrive at the [facility] and Children's Entrance (Entrance C2) of [facility] 30 minutes prior to test start time. You can use the FREE valet parking offered at entrance C (encouraged to control the heart rate for the test)  Proceed to the [facility] Radiology Department (first floor) to check-in and test prep.  All radiology patients and guests should use entrance C2 at [facility], accessed from [facility], even though the hospital's physical address listed is  [address]    If scheduled at [facility], please arrive 15 mins early for check-in and test prep.  Please follow these instructions carefully (unless otherwise directed):  Hold all erectile dysfunction medications at least 3 days (72 hrs) prior to test.  On the Night Before the Test: Be sure to Drink plenty of water. Do not consume any caffeinated/decaffeinated beverages or chocolate 12 hours prior to your test. Do not take any antihistamines 12 hours prior to your test. If the patient has contrast allergy: Patient will need a prescription for Prednisone and very clear instructions (as follows): Prednisone 50 mg - take 13 hours prior to test Take another Prednisone 50 mg 7 hours prior to test Take another Prednisone 50 mg 1 hour prior to test Take Benadryl 50 mg 1 hour prior to test Patient must complete all four doses of above prophylactic medications. Patient will need a ride after test due to Benadryl.  On the Day of the Test: Drink plenty of water until 1 hour prior to the test. Do not eat any food 4 hours prior to the test. You may take your regular medications prior to the test.  Take metoprolol (Lopressor) two hours prior to test. HOLD Furosemide/Hydrochlorothiazide morning of the test. FEMALES- please wear underwire-free bra if available, avoid dresses & tight clothing   *For Clinical Staff only. Please instruct patient the following:* Heart Rate Medication Recommendations for Cardiac CT  Resting HR < 50 bpm  No medication  Resting HR 50-60 bpm and BP >110/50 mmHG   Consider Metoprolol tartrate 25 mg PO 90-120 min prior to scan  Resting HR 60-65 bpm and BP >110/50 mmHG  Metoprolol tartrate 50 mg PO 90-120 minutes prior to scan   Resting HR > 65 bpm and BP >110/50 mmHG  Metoprolol tartrate 100 mg PO 90-120 minutes prior to scan  Consider Ivabradine 10-15 mg PO or a  calcium channel blocker for resting HR >60 bpm and  contraindication to metoprolol tartrate  Consider Ivabradine 10-15 mg PO in combination with metoprolol tartrate for HR >80 bpm         After the Test: Drink plenty of water. After receiving IV contrast, you may experience a mild flushed feeling. This is normal. On occasion, you may experience a mild rash up to 24 hours after the test. This is not dangerous. If this occurs, you can take Benadryl 25 mg and increase your fluid intake. If you experience trouble breathing, this can be serious. If it is severe call 911 IMMEDIATELY. If it is mild, please call our office. If you take any of these medications: Glipizide/Metformin, Avandament, Glucavance, please do not take 48 hours after completing test unless otherwise instructed.  We will call to schedule your test 2-4 weeks out understanding that some insurance companies will need an authorization prior to the service being performed.   For non-scheduling related questions, please contact the cardiac imaging nurse navigator should you have any questions/concerns: Rockwell Alexandria, Cardiac Imaging Nurse Navigator Larey Brick, Cardiac Imaging Nurse Navigator [facility] Direct Office Dial: [phone]   For scheduling needs, including cancellations and rescheduling, please call [place], [phone].    Important Information About Sugar         Signed, Eual Lindstrom [place], MD  10/31/2021 1:46 PM    [facility]

## 2021-10-31 ENCOUNTER — Ambulatory Visit (INDEPENDENT_AMBULATORY_CARE_PROVIDER_SITE_OTHER): Payer: BC Managed Care – PPO | Admitting: Cardiology

## 2021-10-31 ENCOUNTER — Encounter: Payer: Self-pay | Admitting: Cardiology

## 2021-10-31 VITALS — BP 148/90 | HR 96 | Ht 64.0 in | Wt 205.2 lb

## 2021-10-31 DIAGNOSIS — I10 Essential (primary) hypertension: Secondary | ICD-10-CM

## 2021-10-31 DIAGNOSIS — R079 Chest pain, unspecified: Secondary | ICD-10-CM

## 2021-10-31 DIAGNOSIS — R072 Precordial pain: Secondary | ICD-10-CM | POA: Diagnosis not present

## 2021-10-31 DIAGNOSIS — Z720 Tobacco use: Secondary | ICD-10-CM

## 2021-10-31 MED ORDER — METOPROLOL TARTRATE 100 MG PO TABS: ORAL_TABLET | ORAL | 0 refills | Status: AC

## 2021-10-31 MED ORDER — AMLODIPINE BESYLATE 5 MG PO TABS: 5.0000 mg | ORAL_TABLET | Freq: Every day | ORAL | 3 refills | Status: DC

## 2021-10-31 NOTE — Patient Instructions (Addendum)
Medication Instructions:  Start Amlodipine 5 mg daily  Continue all other medications   Lab Work: Bmet and lipid panel today    Testing/Procedures:  Coronary Ct    will be scheduled after approved by insurance   Follow instructions below   Follow-Up: At [facility], you and your health needs are our priority.  As part of our continuing mission to provide you with exceptional heart care, we have created designated Provider Care Teams.  These Care Teams include your primary Cardiologist (physician) and Advanced Practice Providers (APPs -  Physician Assistants and Nurse Practitioners) who all work together to provide you with the care you need, when you need it.  We recommend signing up for the patient portal called "MyChart".  Sign up information is provided on this After Visit Summary.  MyChart is used to connect with patients for Virtual Visits (Telemedicine).  Patients are able to view lab/test results, encounter notes, upcoming appointments, etc.  Non-urgent messages can be sent to your provider as well.   To learn more about what you can do with MyChart, go to [email or web address].    Your next appointment:  After test    The format for your next appointment: Office    Provider:  Dr.Jordan      Your cardiac CT will be scheduled at one of the below locations:   [facility] [address] [place], [place] [address] [phone]  OR  [facility] [address] [place], [place] [address] [phone]  If scheduled at [facility], please arrive at the [facility] and Children's Entrance (Entrance C2) of [facility] 30 minutes prior to test start time. You can use the FREE valet parking offered at entrance C (encouraged to control the heart rate for the test)  Proceed to the [facility] Radiology Department (first floor) to check-in and test prep.  All radiology patients and guests should use  entrance C2 at [facility], accessed from [facility], even though the hospital's physical address listed is [address].    If scheduled at [facility], please arrive 15 mins early for check-in and test prep.  Please follow these instructions carefully (unless otherwise directed):    On the Night Before the Test: Be sure to Drink plenty of water. Do not consume any caffeinated/decaffeinated beverages or chocolate 12 hours prior to your test. Do not take any antihistamines 12 hours prior to your test.   On the Day of the Test: Drink plenty of water until 1 hour prior to the test. Do not eat any food 4 hours prior to the test. You may take your regular medications prior to the test.  Take metoprolol 100 mg two hours prior to test. FEMALES- please wear underwire-free bra if available, avoid dresses & tight clothing          After the Test: Drink plenty of water. After receiving IV contrast, you may experience a mild flushed feeling. This is normal. On occasion, you may experience a mild rash up to 24 hours after the test. This is not dangerous. If this occurs, you can take Benadryl 25 mg and increase your fluid intake. If you experience trouble breathing, this can be serious. If it is severe call 911 IMMEDIATELY. If it is mild, please call our office.   We will call to schedule your test 2-4 weeks out understanding that some insurance companies will need an authorization prior to  the service being performed.   For non-scheduling related questions, please contact the cardiac imaging nurse navigator should you have any questions/concerns: Rockwell [place], Cardiac Imaging Nurse Navigator Larey Brick, Cardiac Imaging Nurse Navigator [facility] Direct Office Dial: [phone]   For scheduling needs, including cancellations and rescheduling, please call [place], [phone].    Important  Information About Sugar

## 2021-11-01 LAB — LIPID PANEL
Chol/HDL Ratio: 6.3 ratio — ABNORMAL HIGH (ref 0.0–4.4)
Cholesterol, Total: 311 mg/dL — ABNORMAL HIGH (ref 100–199)
HDL: 49 mg/dL (ref >39)
LDL Chol Calc (NIH): 216 mg/dL — ABNORMAL HIGH (ref 0–99)
Triglycerides: 232 mg/dL — ABNORMAL HIGH (ref 0–149)
VLDL Cholesterol Cal: 46 mg/dL — ABNORMAL HIGH (ref 5–40)

## 2021-11-01 LAB — BASIC METABOLIC PANEL
BUN/Creatinine Ratio: 11 (ref 9–23)
BUN: 8 mg/dL (ref 6–24)
CO2: 23 mmol/L (ref 20–29)
Calcium: 9.5 mg/dL (ref 8.7–10.2)
Chloride: 103 mmol/L (ref 96–106)
Creatinine, Ser: 0.75 mg/dL (ref 0.57–1.00)
Glucose: 91 mg/dL (ref 70–99)
Potassium: 4.3 mmol/L (ref 3.5–5.2)
Sodium: 142 mmol/L (ref 134–144)
eGFR: 100 mL/min/{1.73_m2} (ref >59)

## 2021-11-12 ENCOUNTER — Other Ambulatory Visit: Payer: Self-pay

## 2021-11-12 DIAGNOSIS — E782 Mixed hyperlipidemia: Secondary | ICD-10-CM

## 2021-11-12 DIAGNOSIS — E7800 Pure hypercholesterolemia, unspecified: Secondary | ICD-10-CM

## 2021-11-12 MED ORDER — ROSUVASTATIN CALCIUM 20 MG PO TABS: 20.0000 mg | ORAL_TABLET | Freq: Every day | ORAL | 3 refills | Status: AC

## 2021-11-19 ENCOUNTER — Telehealth (HOSPITAL_COMMUNITY): Payer: Self-pay | Admitting: *Deleted

## 2021-11-19 NOTE — Telephone Encounter (Signed)
Reaching out to patient to offer assistance regarding upcoming cardiac imaging study; pt verbalizes understanding of appt date/time, parking situation and where to check in, pre-test NPO status and medications ordered, and verified current allergies; name and call back number provided for further questions should they arise  Joyceann Kruser RN Navigator Cardiac Imaging [facility] [phone] office [phone] cell  Patient to take 100mg metoprolol tartrate two hours prior to her cardiac CT scan. She is aware to arrive at 3:30pm. 

## 2021-11-20 ENCOUNTER — Ambulatory Visit (HOSPITAL_COMMUNITY)
Admission: RE | Admit: 2021-11-20 | Discharge: 2021-11-20 | Disposition: A | Discharge status: Home / Self Care | Payer: BC Managed Care – PPO | Source: Ambulatory Visit | Attending: Cardiology | Admitting: Cardiology

## 2021-11-20 DIAGNOSIS — Z720 Tobacco use: Secondary | ICD-10-CM | POA: Insufficient documentation

## 2021-11-20 DIAGNOSIS — I10 Essential (primary) hypertension: Secondary | ICD-10-CM | POA: Insufficient documentation

## 2021-11-20 DIAGNOSIS — R079 Chest pain, unspecified: Secondary | ICD-10-CM | POA: Diagnosis present

## 2021-11-20 DIAGNOSIS — R072 Precordial pain: Secondary | ICD-10-CM | POA: Diagnosis present

## 2021-11-20 MED ORDER — NITROGLYCERIN 0.4 MG SL SUBL
0.8000 mg | SUBLINGUAL_TABLET | Freq: Once | SUBLINGUAL | Status: AC
Start: 2021-11-20 — End: 2021-11-20
  Administered 2021-11-20: 0.8 mg via SUBLINGUAL

## 2021-11-20 MED ORDER — SODIUM CHLORIDE 0.9 % IV SOLN: INTRAVENOUS | Status: DC

## 2021-11-20 MED ORDER — NITROGLYCERIN 0.4 MG SL SUBL
SUBLINGUAL_TABLET | SUBLINGUAL | Status: AC
  Filled 2021-11-20: qty 2

## 2021-11-20 MED ORDER — IOHEXOL 350 MG/ML SOLN
100.0000 mL | Freq: Once | INTRAVENOUS | Status: AC | PRN
  Administered 2021-11-20: 100 mL via INTRAVENOUS

## 2021-11-24 ENCOUNTER — Telehealth: Payer: Self-pay | Admitting: Cardiology

## 2021-11-24 DIAGNOSIS — R21 Rash and other nonspecific skin eruption: Secondary | ICD-10-CM

## 2021-11-24 MED ORDER — PREDNISONE 5 MG PO TABS: ORAL_TABLET | ORAL | 0 refills | Status: DC

## 2021-11-24 MED ORDER — PREDNISONE 5 MG PO TABS: ORAL_TABLET | ORAL | 0 refills | Status: AC

## 2021-11-24 NOTE — Telephone Encounter (Signed)
Patient states 9/14 CT caused a severe reaction. On Friday, 9/15 she had redness underneath her breasts. She states she read that if a rash develops then she should take Benedryl and stay hydrated, so she took a Benadryl. However, Saturday, 9/16, it got worse--redness spread to her face, chest, stomach, back, buttocks, between legs. She also developed itchy bumps all over her body. She states she used lotion and Benedryl extra strength, but still no relief. Please advise.

## 2021-11-24 NOTE — Telephone Encounter (Signed)
Patient states that a day after her CCTA, she has broken out with a generalized rash with "bumps" and is itching. She took benadryl as recommended and has used benadryl cream with no relief. She stated her PCP told her to contact our clinic. No issues with breathing. Please advise.

## 2021-11-24 NOTE — Telephone Encounter (Signed)
Informed patient of deltasone order and how to take the medication. She verbalized understanding and stated if this doe not help, she wants a steroid injection.

## 2021-11-24 NOTE — Addendum Note (Signed)
Addended by: Betha Loa F on: 11/24/2021 01:01 PM   Modules accepted: Orders

## 2021-11-26 ENCOUNTER — Other Ambulatory Visit: Payer: Self-pay | Admitting: Physician Assistant

## 2021-11-26 ENCOUNTER — Other Ambulatory Visit (HOSPITAL_COMMUNITY): Payer: Self-pay | Admitting: Physician Assistant

## 2021-11-26 DIAGNOSIS — K769 Liver disease, unspecified: Secondary | ICD-10-CM

## 2021-12-01 ENCOUNTER — Ambulatory Visit: Payer: BC Managed Care – PPO | Admitting: Cardiology

## 2021-12-18 ENCOUNTER — Ambulatory Visit (HOSPITAL_COMMUNITY)
Admission: RE | Admit: 2021-12-18 | Discharge: 2021-12-18 | Disposition: A | Discharge status: Home / Self Care | Payer: BC Managed Care – PPO | Source: Ambulatory Visit | Attending: Physician Assistant | Admitting: Physician Assistant

## 2021-12-18 DIAGNOSIS — K769 Liver disease, unspecified: Secondary | ICD-10-CM | POA: Insufficient documentation

## 2021-12-18 MED ORDER — GADOBUTROL 1 MMOL/ML IV SOLN
10.0000 mL | Freq: Once | INTRAVENOUS | Status: AC | PRN
  Administered 2021-12-18: 10 mL via INTRAVENOUS

## 2022-05-07 ENCOUNTER — Encounter: Payer: Self-pay | Admitting: Radiology

## 2022-10-13 ENCOUNTER — Other Ambulatory Visit (HOSPITAL_COMMUNITY): Payer: Self-pay | Admitting: Family Medicine

## 2022-10-13 DIAGNOSIS — J0101 Acute recurrent maxillary sinusitis: Secondary | ICD-10-CM

## 2022-10-13 DIAGNOSIS — R519 Headache, unspecified: Secondary | ICD-10-CM

## 2022-10-22 ENCOUNTER — Encounter (HOSPITAL_BASED_OUTPATIENT_CLINIC_OR_DEPARTMENT_OTHER): Payer: Self-pay

## 2022-10-22 ENCOUNTER — Ambulatory Visit (HOSPITAL_BASED_OUTPATIENT_CLINIC_OR_DEPARTMENT_OTHER): Payer: Commercial Managed Care - HMO

## 2022-11-12 ENCOUNTER — Telehealth: Payer: Self-pay | Admitting: Orthopedic Surgery

## 2022-11-12 NOTE — Telephone Encounter (Signed)
Returned the patient's call, lvm for the patient to call us back. 

## 2023-01-21 ENCOUNTER — Ambulatory Visit: Payer: Commercial Managed Care - HMO | Admitting: Neurology

## 2023-01-21 ENCOUNTER — Telehealth: Payer: Self-pay | Admitting: Neurology

## 2023-01-21 NOTE — Telephone Encounter (Signed)
NS for new Neuro consult.

## 2023-02-09 ENCOUNTER — Encounter: Payer: Self-pay | Admitting: Neurology

## 2023-02-09 ENCOUNTER — Ambulatory Visit (INDEPENDENT_AMBULATORY_CARE_PROVIDER_SITE_OTHER): Payer: Commercial Managed Care - HMO | Admitting: Neurology

## 2023-02-09 VITALS — BP 173/103 | HR 77 | Ht 64.0 in | Wt 222.0 lb

## 2023-02-09 DIAGNOSIS — E669 Obesity, unspecified: Secondary | ICD-10-CM

## 2023-02-09 DIAGNOSIS — R030 Elevated blood-pressure reading, without diagnosis of hypertension: Secondary | ICD-10-CM | POA: Diagnosis not present

## 2023-02-09 DIAGNOSIS — R519 Headache, unspecified: Secondary | ICD-10-CM

## 2023-02-09 DIAGNOSIS — G4440 Drug-induced headache, not elsewhere classified, not intractable: Secondary | ICD-10-CM

## 2023-02-09 DIAGNOSIS — R0683 Snoring: Secondary | ICD-10-CM

## 2023-02-09 DIAGNOSIS — R351 Nocturia: Secondary | ICD-10-CM

## 2023-02-09 DIAGNOSIS — Z9189 Other specified personal risk factors, not elsewhere classified: Secondary | ICD-10-CM | POA: Diagnosis not present

## 2023-02-09 DIAGNOSIS — G4489 Other headache syndrome: Secondary | ICD-10-CM

## 2023-02-09 MED ORDER — RIZATRIPTAN BENZOATE 5 MG PO TABS: 5.0000 mg | ORAL_TABLET | ORAL | 1 refills | Status: DC | PRN

## 2023-02-09 NOTE — Progress Notes (Signed)
Subjective:    Patient ID: Annette Li is a 47 y.o. female.  HPI    Huston Foley, MD, PhD [facility] [address] [address] [place], [place] [address]  Dear Natalia Leatherwood,  I saw your patient, Annette Li, upon your kind request in my neurologic clinic today for initial consultation of her headaches.  The patient is unaccompanied today.  She missed an appointment on 01/21/2023.  As you know, [name] is a 47 year old female with an underlying medical history of hypertension, anxiety, allergies, sinusitis, asthma, and obesity, who reports a several month history of recurrent headaches. Headaches are in the bifrontal areas and described as sharp and shooting pains, she often wakes up with a headache.  She has occasional photophobia, occasional nausea, rare vomiting.  She has a family history of migraines affecting 2 sisters.  She has been on propranolol long-acting 60 mg once daily for the past nearly 3 months.  She has not noticed a big difference with it.  She has not taken any migraine medication but does take multiple over-the-counter medications, including extra strength Tylenol 2 tablets every 4-6 hours, ibuprofen over-the-counter or Aleve over-the-counter 2 tablets at a time every 4-6 hours or Excedrin Migraine.  She takes medications nearly daily at this time.  She has nearly daily headaches at this time.  She is typically up-to-date with her eye exam.  She goes to my eye doctor and had an appointment in March, she had a dilated exam, she has contacts and eyeglasses.  She denies any sudden onset of one-sided weakness or numbness or tingling or droopy face or slurring of speech.  She has neck pain which occasionally radiates to the right arm.  She had prior cervical spine surgery in 2017.   I reviewed your office note from 10/12/2022.  She has been treated with cyclobenzaprine and propranolol.  She had blood work through your office on 10/12/2022 and I was able to  review the results in her paper chart: CMP showed benign findings, glucose level was 107 which is borderline elevated.  BUN was 7, creatinine 0.57, liver enzymes normal.  CBC with differential and platelets benign, TSH was 2.68, COVID test negative.  She had a head CT without contrast through [facility] health on 09/09/2021 and I reviewed the results: Impression: No acute intracranial abnormalities.  Normal brain.  You ordered a CT of the sinuses.  I did not see those test results.   She snores, she has woken up with a sense of gasping for air.  She has nocturia about once or twice per average night and has frequently woken up with a headache.  She has never had a sleep study.  Her Epworth sleepiness score is 8 out of 24, fatigue severity score is 46 out of 63.  Weight wise, she had been able to lose weight in the past, she was below 200 pounds but gained most if not all weight back.  She works in Clinical biochemist in an office situation.  She smokes about 5 cigarettes/day.  She drinks alcohol rarely, no daily caffeine.    Her Past Medical History Is Significant For: Past Medical History:  Diagnosis Date   Anxiety    Asthmatic bronchitis    Headache    Hemorrhoid    Hypertension     Her Past Surgical History Is Significant For: Past Surgical History:  Procedure Laterality Date   CERVICAL SPINE SURGERY     COLONOSCOPY  05/27/2012   (normal)Dr.  Benson--[place]   KNEE SURGERY     TUBAL LIGATION     uterine ablation      Her Family History Is Significant For: Family History  Problem Relation Age of Onset   Colon cancer Mother        Deceased 02/20/01   Colon cancer Sister    Migraines Sister    Migraines Sister    Other Son        headaches    Her Social History Is Significant For: Social History   Socioeconomic History   Marital status: Single    Spouse name: Not on file   Number of children: Not on file   Years of education: Not on file   Highest education level: Not on file   Occupational History   Not on file  Tobacco Use   Smoking status: Every Day    Current packs/day: 0.15    Types: Cigarettes   Smokeless tobacco: Never   Tobacco comments:    4-5 cigarettes per day  Vaping Use   Vaping status: Never Used  Substance and Sexual Activity   Alcohol use: Yes    Comment: occasionally "here and there, hit or miss"   Drug use: No   Sexual activity: Not on file  Other Topics Concern   Not on file  Social History Narrative   On Workman's comp         Update 02/09/2023   Caffeine: here and there   Right handed   Social Determinants of Health   Financial Resource Strain: Not on file  Food Insecurity: Not on file  Transportation Needs: Not on file  Physical Activity: Not on file  Stress: Not on file  Social Connections: Unknown (07/07/2021)   Received from [facility]   Social Network    Social Network: Not on file    Her Allergies Are:  Allergies  Allergen Reactions   Other     Possible allergic reaction to contrast or other medication given during coronary CT. "Took my skin off", peeling from shoulders down   :   Her Current Medications Are:  Outpatient Encounter Medications as of 02/09/2023  Medication Sig   ALPRAZolam (XANAX) 0.5 MG tablet Take 0.5 mg by mouth 3 (three) times daily.   cyclobenzaprine (FLEXERIL) 10 MG tablet Take 10 mg by mouth at bedtime.   losartan (COZAAR) 100 MG tablet Take 100 mg by mouth daily.   omeprazole (PRILOSEC) 20 MG capsule Take 20 mg by mouth daily.   propranolol ER (INDERAL LA) 60 MG 24 hr capsule Take 60 mg by mouth daily.   metoprolol tartrate (LOPRESSOR) 100 MG tablet Take 100 mg 2 hours before coronary ct (Patient not taking: Reported on 02/09/2023)   predniSONE (DELTASONE) 5 MG tablet Take 40mg  (8 tablets together on days 1 and 2). Take 30mg  (6 tablets together) on day 3. Take 20mg  (4 tablets) on day 4. Take 10mg  (2 tablets together) on day 5. (Patient not taking: Reported on 02/09/2023)    rosuvastatin (CRESTOR) 20 MG tablet Take 1 tablet (20 mg total) by mouth daily.   [DISCONTINUED] amLODipine (NORVASC) 5 MG tablet Take 1 tablet (5 mg total) by mouth daily. (Patient not taking: Reported on 02/09/2023)   [DISCONTINUED] DULoxetine (CYMBALTA) 30 MG capsule Take 30 mg by mouth daily. (Patient not taking: Reported on 02/09/2023)   No facility-administered encounter medications on file as of 02/09/2023.  :   Review of Systems:  Out of a complete 14 point  review of systems, all are reviewed and negative with the exception of these symptoms as listed below:   Review of Systems  Neurological:        Patient is here alone for evaluation of headaches. She has been having them for several months. She has no known head injury that precipitated the headaches. She is on Propranolol daily. She has also tried Tylenol, Aleve, Excedrin, Ibuprofen and only sometimes do they help. She will get the headaches for days, "bad".    Objective:  Neurological Exam  Physical Exam Physical Examination:   Vitals:   02/09/23 0754  BP: (!) 173/103  Pulse: 77    General Examination: The patient is a very pleasant 47 y.o. female in no acute distress. She appears well-developed and well-nourished and well groomed.   HEENT: Normocephalic, atraumatic, pupils are equal, round and reactive to light, extraocular tracking is good without limitation to gaze excursion or nystagmus noted.  Funduscopic exam benign, no photophobia.  Hearing is grossly intact. Face is symmetric with normal facial animation and normal facial sensation to light touch, temperature and vibration sense. Speech is clear with no dysarthria noted. There is no hypophonia. There is no lip, neck/head, jaw or voice tremor. Neck is supple with full range of passive and active motion. There are no carotid bruits on auscultation. Oropharynx exam reveals: mild mouth dryness, adequate dental hygiene and moderate airway crowding, due to small airway  entry, slightly wider uvula and slightly wider tongue base.  Tonsils about 1+.  Neck circumference 15 inches.  Tongue protrudes centrally and palate elevates symmetrically.  Chest: Clear to auscultation without wheezing, rhonchi or crackles noted.  Heart: S1+S2+0, regular and normal without murmurs, rubs or gallops noted.   Abdomen: Soft, non-tender and non-distended.  Extremities: There is no pitting edema in the distal lower extremities bilaterally.   Skin: Warm and dry without trophic changes noted.   Musculoskeletal: exam reveals no obvious joint deformities.   Neurologically:  Mental status: The patient is awake, alert and oriented in all 4 spheres. Her immediate and remote memory, attention, language skills and fund of knowledge are appropriate. There is no evidence of aphasia, agnosia, apraxia or anomia. Speech is clear with normal prosody and enunciation. Thought process is linear. Mood is normal and affect is normal.  Cranial nerves II - XII are as described above under HEENT exam.  Motor exam: Normal bulk, strength and tone is noted.  No drift or rebound.  There is no obvious action or resting tremor.  Fine motor skills and coordination: Intact finger taps, hand movements and rapid alternating patting in both upper extremities, normal foot taps bilaterally in the lower extremities.  Cerebellar testing: No dysmetria or intention tremor. There is no truncal or gait ataxia.  Normal finger-to-nose, normal heel-to-shin bilaterally. Sensory exam: intact to light touch, temperature and vibration sense in the upper and lower extremities.  Romberg negative, reflexes 1+ throughout, toes are downgoing bilaterally.   Gait, station and balance: She stands easily. No veering to one side is noted. No leaning to one side is noted. Posture is age-appropriate and stance is narrow based. Gait shows normal stride length and normal pace. No problems turning are noted.  Tandem walk normal.    Assessment  and Plan:  In summary, YLANA MCKENNON is a very pleasant 47 y.o.-year old female with an underlying medical history of hypertension, anxiety, allergies, sinusitis, asthma, and obesity, who presents for evaluation of her recurrent headaches of several months duration.  History is not telltale for a single cause for her headache.  Headache is likely a combination of multiple factors, including elevated blood pressure values, sleep disordered breathing, medication overuse, migrainous features at times also.  Strain on the eyes is less likely as she is up-to-date with her eye exam.  I had an extended discussion with the patient today.  We talked about further evaluation.  Below is a summary of my recommendations and our discussion points from today's visit.  She was given these instructions verbally and also in writing in her MyChart after visit summary, she endorsed that she was able to access my chart electronically.  Neurological exam is nonfocal.  <<  Please remember, common headache triggers are: sleep deprivation, dehydration, overheating, stress, hypoglycemia or skipping meals and blood sugar fluctuations, excessive pain medications or excessive alcohol use or caffeine withdrawal. Some people have food triggers such as aged cheese, orange juice or chocolate, especially dark chocolate, or MSG (monosodium glutamate). Try to avoid these headache triggers as much possible. It may be helpful to keep a headache diary to figure out what makes your headaches worse or brings them on and what alleviates them. Some people report headache onset after exercise but studies have shown that regular exercise may actually prevent headaches from coming. If you have exercise-induced headaches, please make sure that you drink plenty of fluid before and after exercising and that you do not over do it and do not overheat. Please reduce and eliminate daily use of over-the-counter pain medications including ibuprofen, Excedrin,  Tylenol, and Aleve.  These medications can perpetuate headaches, we called this phenomenon medication overuse headaches.  We will do a brain scan, called MRI and call you with the test results. We will have to schedule you for this on a separate date. This test requires authorization from your insurance, and we will take care of the insurance process. I will order a home sleep test to look for signs of obstructive sleep apnea (aka OSA). As explained, the long-term risks and ramifications of untreated moderate to severe obstructive sleep apnea may include (but are not limited to): increased risk for cardiovascular disease, including congestive heart failure, stroke, difficult to control hypertension, treatment resistant obesity, arrhythmias, especially irregular heartbeat commonly known as A. Fib. (atrial fibrillation); even type 2 diabetes has been linked to untreated OSA.  For headache prevention, I suggest you continue with propranolol 60 mg daily as prescribed by your PCP.  For as needed use, I recommend Maxalt tab, 5 mg: take 1 pill early on when you suspect a migraine attack come on. You may take another pill after 2 hours, if needed.  No more than 2 pills in 24 hours. Most people who take triptans do not have any serious side-effects. However, they can cause drowsiness (remember to not drive or use heavy machinery when drowsy), nausea, dizziness, dry mouth. Less common side effects include strange sensations, such as tightness in your chest or throat, tingling, flushing, and feelings of heaviness or pressure in areas such as the face, limbs, and chest. These in the chest can mimic heart related pain (angina) and may cause alarm, but usually these sensations are not harmful or a sign of a heart attack. However, if you develop intense chest pain or sensations of discomfort, you should stop taking your medication and consult with me or your PCP or go to the nearest urgent care facility or ER or call 911.  We  will plan a follow up in about  4 months.  Please ask your primary care to send [place] the report on your sinus CT scan. >>  This was an extended visit of over 60 minutes with extended chart review involved, addressing multiple problems and in-depth counseling and coordination of care.  Thank you very much for allowing me to participate in the care of this nice patient. If I can be of any further assistance to you please do not hesitate to call me at [phone].  Sincerely,   Huston Foley, MD, PhD

## 2023-02-09 NOTE — Patient Instructions (Addendum)
It was nice to meet you today.   As discussed, your headaches are likely due to a combination of factors.   Here is what we discussed today and my recommendations for you:   Please remember, common headache triggers are: sleep deprivation, dehydration, overheating, stress, hypoglycemia or skipping meals and blood sugar fluctuations, excessive pain medications or excessive alcohol use or caffeine withdrawal. Some people have food triggers such as aged cheese, orange juice or chocolate, especially dark chocolate, or MSG (monosodium glutamate). Try to avoid these headache triggers as much possible. It may be helpful to keep a headache diary to figure out what makes your headaches worse or brings them on and what alleviates them. Some people report headache onset after exercise but studies have shown that regular exercise may actually prevent headaches from coming. If you have exercise-induced headaches, please make sure that you drink plenty of fluid before and after exercising and that you do not over do it and do not overheat. Please reduce and eliminate daily use of over-the-counter pain medications including ibuprofen, Excedrin, Tylenol, and Aleve.  These medications can perpetuate headaches, we called this phenomenon medication overuse headaches.  We will do a brain scan, called MRI and call you with the test results. We will have to schedule you for this on a separate date. This test requires authorization from your insurance, and we will take care of the insurance process. I will order a home sleep test to look for signs of obstructive sleep apnea (aka OSA). As explained, the long-term risks and ramifications of untreated moderate to severe obstructive sleep apnea may include (but are not limited to): increased risk for cardiovascular disease, including congestive heart failure, stroke, difficult to control hypertension, treatment resistant obesity, arrhythmias, especially irregular heartbeat commonly  known as A. Fib. (atrial fibrillation); even type 2 diabetes has been linked to untreated OSA.  For headache prevention, I suggest you continue with propranolol 60 mg daily as prescribed by your PCP.  For as needed use, I recommend Maxalt tab, 5 mg: take 1 pill early on when you suspect a migraine attack come on. You may take another pill after 2 hours, if needed.  No more than 2 pills in 24 hours. Most people who take triptans do not have any serious side-effects. However, they can cause drowsiness (remember to not drive or use heavy machinery when drowsy), nausea, dizziness, dry mouth. Less common side effects include strange sensations, such as tightness in your chest or throat, tingling, flushing, and feelings of heaviness or pressure in areas such as the face, limbs, and chest. These in the chest can mimic heart related pain (angina) and may cause alarm, but usually these sensations are not harmful or a sign of a heart attack. However, if you develop intense chest pain or sensations of discomfort, you should stop taking your medication and consult with me or your PCP or go to the nearest urgent care facility or ER or call 911.  We will plan a follow up in about 4 months.  Please ask your primary care to send [place] the report on your sinus CT scan.

## 2023-02-17 NOTE — Addendum Note (Signed)
Addended by: Bertram Savin on: 02/17/2023 01:20 PM   Modules accepted: Orders

## 2023-02-23 ENCOUNTER — Telehealth: Payer: Self-pay | Admitting: Neurology

## 2023-02-23 NOTE — Telephone Encounter (Signed)
sent to GI they obtain [facility] [place]: [id number] exp. 02/23/23-04/09/23  [phone]

## 2023-03-17 ENCOUNTER — Encounter: Payer: Self-pay | Admitting: Neurology

## 2023-03-23 ENCOUNTER — Ambulatory Visit
Admission: RE | Admit: 2023-03-23 | Discharge: 2023-03-23 | Disposition: A | Discharge status: Home / Self Care | Payer: Commercial Managed Care - HMO | Source: Ambulatory Visit | Attending: Neurology | Admitting: Neurology

## 2023-03-23 DIAGNOSIS — R519 Headache, unspecified: Secondary | ICD-10-CM | POA: Diagnosis not present

## 2023-03-23 DIAGNOSIS — G4440 Drug-induced headache, not elsewhere classified, not intractable: Secondary | ICD-10-CM

## 2023-03-23 DIAGNOSIS — G4489 Other headache syndrome: Secondary | ICD-10-CM

## 2023-03-23 DIAGNOSIS — R351 Nocturia: Secondary | ICD-10-CM

## 2023-03-23 DIAGNOSIS — R030 Elevated blood-pressure reading, without diagnosis of hypertension: Secondary | ICD-10-CM

## 2023-03-23 DIAGNOSIS — R0683 Snoring: Secondary | ICD-10-CM

## 2023-03-23 DIAGNOSIS — E669 Obesity, unspecified: Secondary | ICD-10-CM

## 2023-03-23 DIAGNOSIS — Z9189 Other specified personal risk factors, not elsewhere classified: Secondary | ICD-10-CM

## 2023-03-23 MED ORDER — GADOPICLENOL 0.5 MMOL/ML IV SOLN
10.0000 mL | Freq: Once | INTRAVENOUS | Status: AC | PRN
  Administered 2023-03-23: 10 mL via INTRAVENOUS

## 2023-04-05 ENCOUNTER — Other Ambulatory Visit: Payer: Self-pay | Admitting: Neurology

## 2023-04-28 ENCOUNTER — Other Ambulatory Visit: Payer: Self-pay

## 2023-04-28 ENCOUNTER — Ambulatory Visit (INDEPENDENT_AMBULATORY_CARE_PROVIDER_SITE_OTHER): Payer: Commercial Managed Care - HMO | Admitting: Orthopaedic Surgery

## 2023-04-28 ENCOUNTER — Encounter: Payer: Self-pay | Admitting: Orthopaedic Surgery

## 2023-04-28 VITALS — Ht 62.0 in | Wt 223.0 lb

## 2023-04-28 DIAGNOSIS — M4322 Fusion of spine, cervical region: Secondary | ICD-10-CM | POA: Diagnosis not present

## 2023-04-28 DIAGNOSIS — M545 Low back pain, unspecified: Secondary | ICD-10-CM | POA: Insufficient documentation

## 2023-04-28 DIAGNOSIS — M25511 Pain in right shoulder: Secondary | ICD-10-CM | POA: Diagnosis not present

## 2023-04-28 DIAGNOSIS — G8929 Other chronic pain: Secondary | ICD-10-CM

## 2023-04-28 DIAGNOSIS — M542 Cervicalgia: Secondary | ICD-10-CM | POA: Diagnosis not present

## 2023-04-28 NOTE — Progress Notes (Signed)
 Office Visit Note   Patient: Annette Li           Date of Birth: Jun 29, 1975           MRN: [id number] Visit Date: 04/28/2023              Requested by: Royann Shivers, PA-C [address] [place],  [place] [address] PCP: Royann Shivers, PA-C   Assessment & Plan: Visit Diagnoses:  1. Neck pain   2. Chronic bilateral low back pain, unspecified whether sciatica present   3. Cervical vertebral fusion     Plan: Results were reviewed.  She states she has had interviews but each time the pending Worker's Comp. claim with lawsuit is not closed.  She states this should be closed hopefully soon.  We discussed activities to be best avoided which would be avoiding overhead lifting activities.  MRI scan of her lumbar spine is totally normal.  We discussed using rope at home over the door or wall walking with her fingertips to help working on getting last few degrees of flexion of her shoulder to prevent further stiffness in her shoulder.  No surgery recommended at this time.  She understands at some point the level above her solid fusion at C5-6 has some moderate stenosis which is C4-5 and might need surgery at some point in the future.  Follow-Up Instructions: No follow-ups on file.   Orders:  Orders Placed This Encounter  Procedures   XR Cervical Spine 2 or 3 views   XR Lumbar Spine 2-3 Views   No orders of the defined types were placed in this encounter.     Procedures: No procedures performed   Clinical Data: No additional findings.   Subjective: Chief Complaint  Patient presents with   Neck - Pain   Lower Back - Pain   Right Shoulder - Pain    HPI 48 year old female new patient visit with chronic low back pain and chronic neck and shoulder pain.  She has had MRI scans lumbar spine cervical spine.  She states she had an injury at work has a pending lawsuit.  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  She has had numbness and tingling some discomfort in her neck difficulty opening jars tingling in her face.  She has an attorney and has had MRI scan at [facility] imaging which shows normal lumbar spine.  Incidental uterine fibroid.  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  Operative level which was from surgery prior to her accident look good.  MRI scan of her shoulder also done which showed some changes consistent with adhesive capsulitis and mild supraspinatus tendinopathy.  She has been able to get her arm up almost completely overhead sometimes it feels stiff.  Review of Systems all other systems noncontributory to HPI.   Objective: Vital Signs: Ht 5\' 2"  (1.575 m)   Wt 223 lb (101.2 kg)   BMI 40.79 kg/m   Physical Exam Constitutional:      Appearance: She is well-developed.  HENT:     Head: Normocephalic.     Right Ear: External ear normal.     Left Ear: External ear normal. There is no impacted cerumen.  Eyes:  Pupils: Pupils are equal, round, and reactive to light.  Neck:     Thyroid: No thyromegaly.     Trachea: No tracheal deviation.  Cardiovascular:     Rate and Rhythm: Normal rate.  Pulmonary:     Effort: Pulmonary effort is normal.  Abdominal:     Palpations: Abdomen is soft.  Musculoskeletal:     Cervical back: No rigidity.  Skin:    General: Skin is warm and dry.  Neurological:     Mental Status: She is alert and oriented to person, place, and time.  Psychiatric:        Behavior: Behavior normal.     Ortho Exam patient lacks 10 degrees reaching full overhead abduction.  Reflexes are intact upper and lower extremities no hyperreflexia no myelopathic gait changes.  No atrophy no rash over exposed skin.  Specialty Comments:  No specialty comments  available.  Imaging: No results found.   PMFS History: Patient Active Problem List   Diagnosis Date Noted   Low back pain 04/28/2023   Cervical vertebral fusion 04/28/2023   Pain in right shoulder 04/28/2023   Internal hemorrhoids 06/27/2013   Past Medical History:  Diagnosis Date   Anxiety    Asthmatic bronchitis    Headache    Hemorrhoid    Hypertension     Family History  Problem Relation Age of Onset   Colon cancer Mother        Deceased 2000-05-19   Colon cancer Sister    Migraines Sister    Migraines Sister    Other Son        headaches    Past Surgical History:  Procedure Laterality Date   CERVICAL SPINE SURGERY     COLONOSCOPY  05/27/2012   (normal)Dr. Benson--[place]   KNEE SURGERY     TUBAL LIGATION     uterine ablation     Social History   Occupational History   Not on file  Tobacco Use   Smoking status: Every Day    Current packs/day: 0.15    Types: Cigarettes   Smokeless tobacco: Never   Tobacco comments:    4-5 cigarettes per day  Vaping Use   Vaping status: Never Used  Substance and Sexual Activity   Alcohol use: Yes    Comment: occasionally "here and there, hit or miss"   Drug use: No   Sexual activity: Not on file

## 2023-06-10 ENCOUNTER — Telehealth: Payer: Self-pay | Admitting: Neurology

## 2023-06-10 ENCOUNTER — Ambulatory Visit: Payer: Commercial Managed Care - HMO | Admitting: Neurology

## 2023-06-10 NOTE — Telephone Encounter (Signed)
NS for FU appt.

## 2023-06-18 ENCOUNTER — Other Ambulatory Visit: Payer: Self-pay | Admitting: Neurology

## 2023-07-19 IMAGING — DX DG FEMUR 2+V*R*
4 series · 4 of 4 positions shown · non-contrast
Comparison: None.

CLINICAL DATA: Work injury

EXAM:
RIGHT FEMUR 2 VIEWS

[femur ap (1 of 2)]
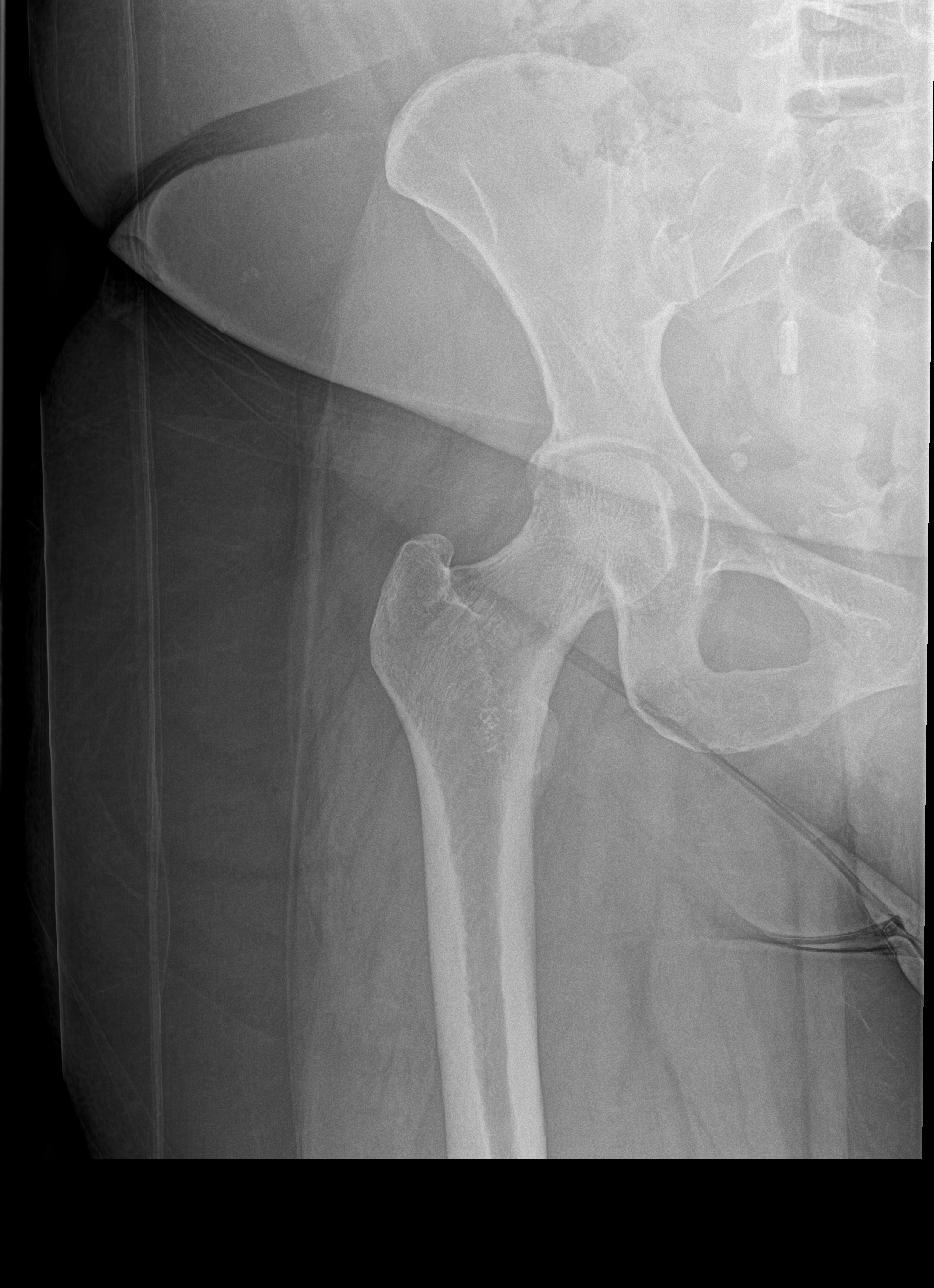

[femur ap (2 of 2)]
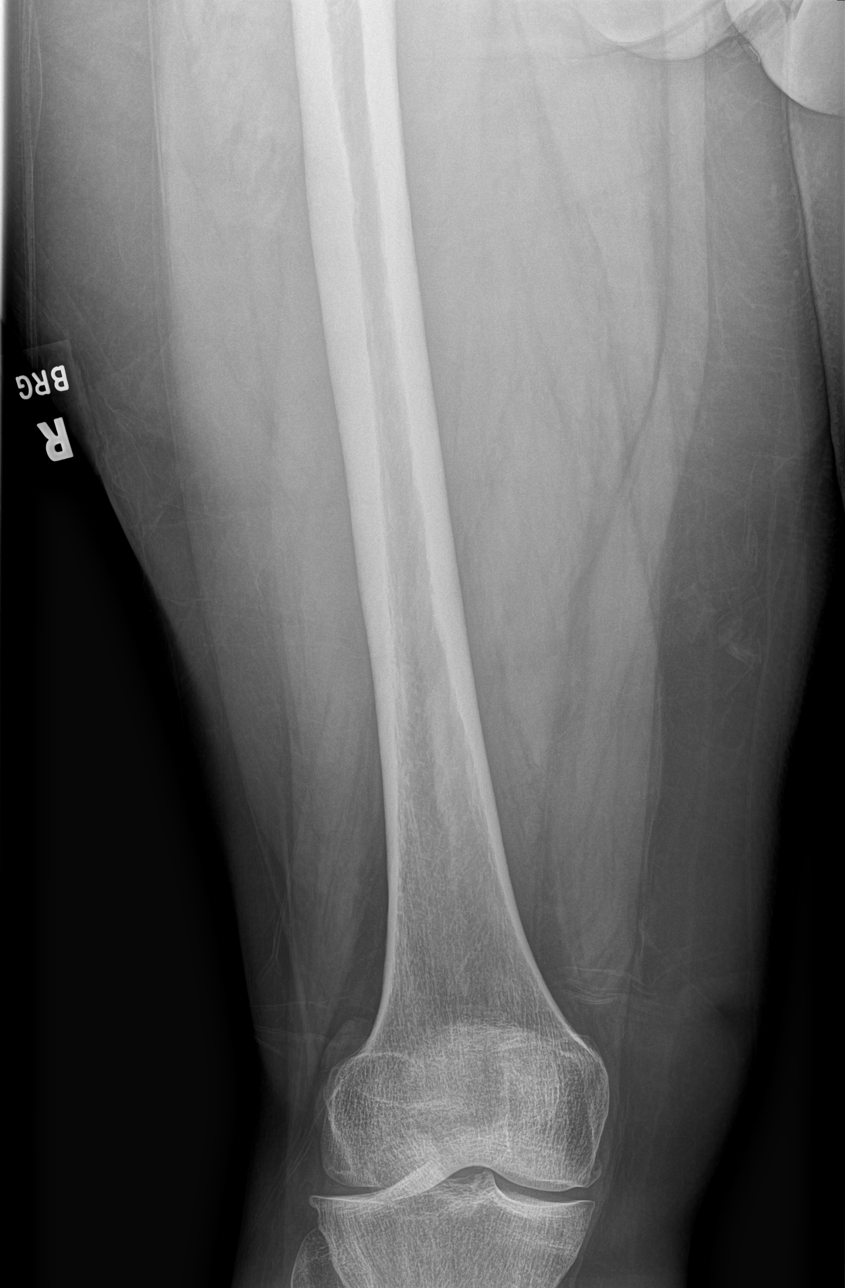

[femur lat (1 of 2)]
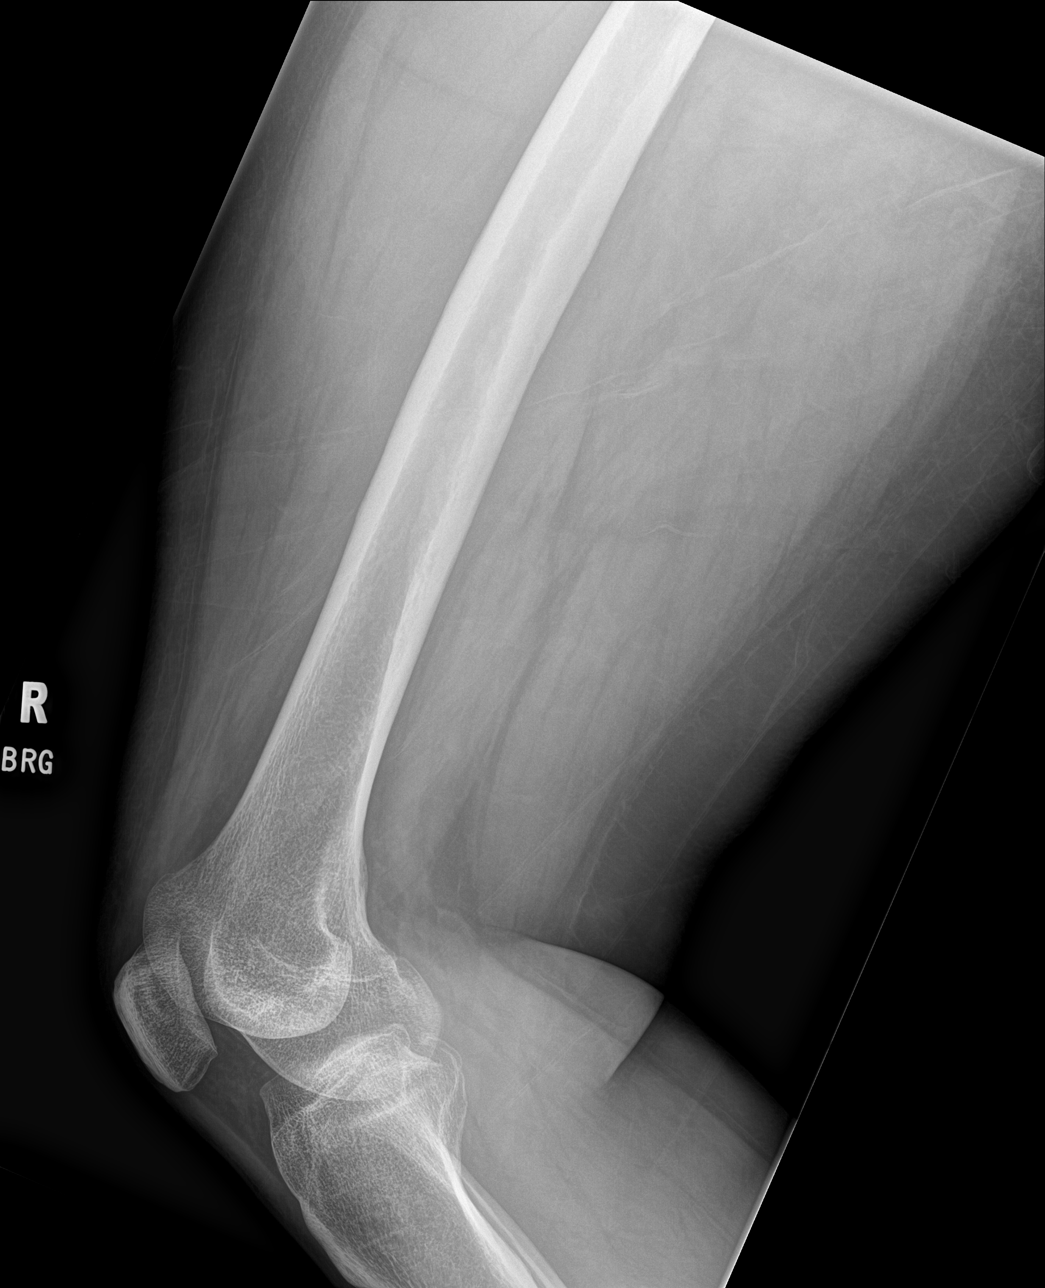

[femur lat (2 of 2)]
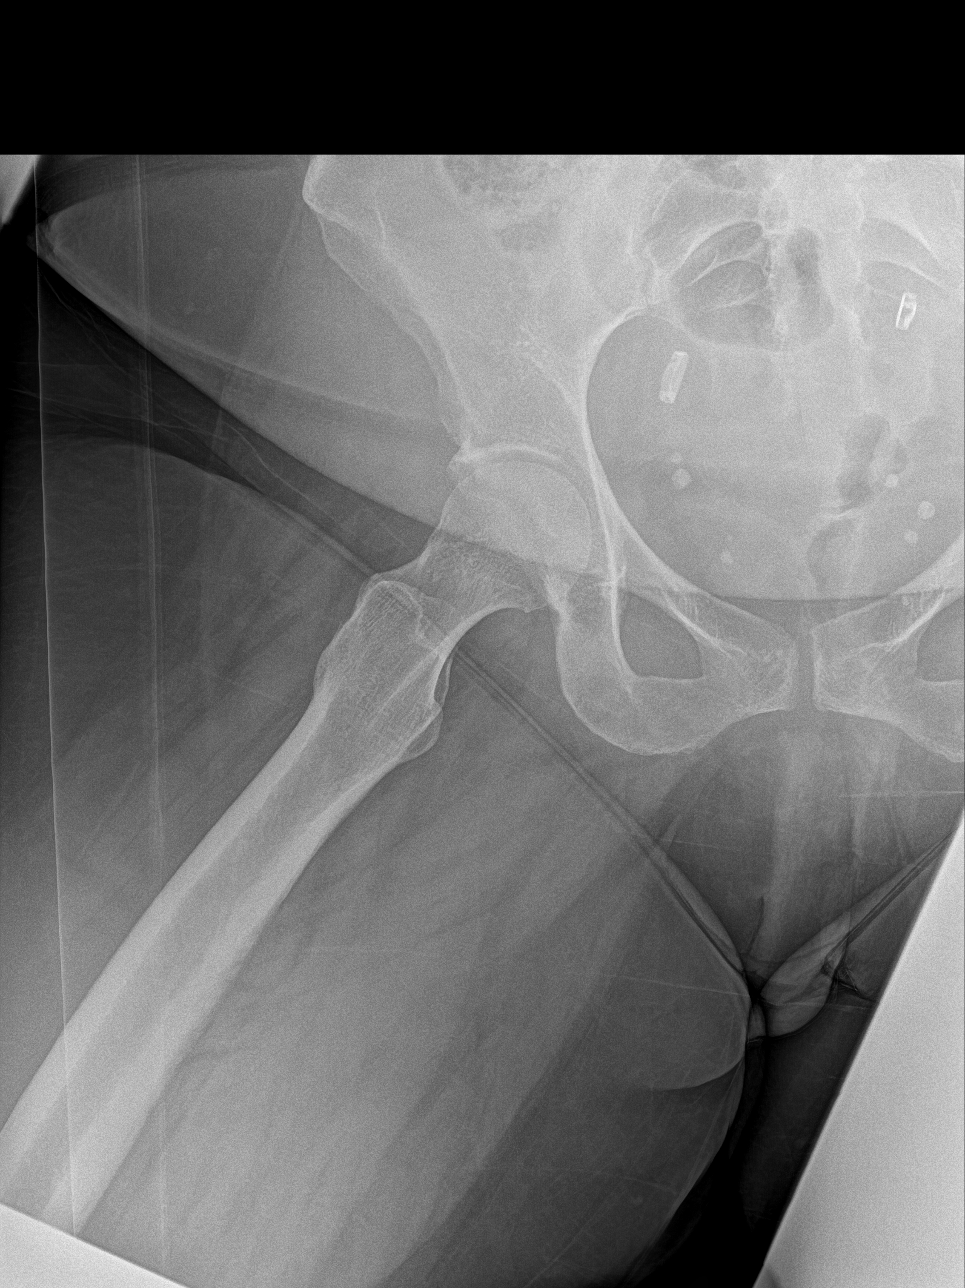

[4 of 4 positions shown; findings below may reference images not displayed]

FINDINGS: There is no evidence of fracture or other focal bone lesions. Soft
tissues are unremarkable.
IMPRESSION: Negative.

## 2023-10-27 ENCOUNTER — Other Ambulatory Visit: Payer: Self-pay | Admitting: Neurology

## 2023-11-11 ENCOUNTER — Other Ambulatory Visit: Payer: Self-pay | Admitting: Neurology

## 2023-12-24 ENCOUNTER — Other Ambulatory Visit: Payer: Self-pay | Admitting: Neurology

## 2024-01-10 ENCOUNTER — Encounter: Payer: Self-pay | Admitting: Radiology

## 2024-01-11 ENCOUNTER — Other Ambulatory Visit: Payer: Self-pay | Admitting: Neurology

## 2024-04-10 ENCOUNTER — Other Ambulatory Visit: Payer: Self-pay | Admitting: Neurology
